# Patient Record
Sex: Female | Born: 1950 | Race: White | Hispanic: No | Marital: Married | Smoking: Never smoker
Health system: Southern US, Community
[De-identification: ages and names within clinical notes are randomized; demographics above are authoritative.]

## PROBLEM LIST (undated history)

## (undated) DIAGNOSIS — R112 Nausea with vomiting, unspecified: Secondary | ICD-10-CM

## (undated) DIAGNOSIS — Z9889 Other specified postprocedural states: Secondary | ICD-10-CM

## (undated) DIAGNOSIS — M1380 Other specified arthritis, unspecified site: Secondary | ICD-10-CM

## (undated) DIAGNOSIS — J449 Chronic obstructive pulmonary disease, unspecified: Secondary | ICD-10-CM

## (undated) DIAGNOSIS — N393 Stress incontinence (female) (male): Secondary | ICD-10-CM

## (undated) DIAGNOSIS — E890 Postprocedural hypothyroidism: Secondary | ICD-10-CM

## (undated) DIAGNOSIS — Z85850 Personal history of malignant neoplasm of thyroid: Secondary | ICD-10-CM

## (undated) HISTORY — PX: TONSILLECTOMY: SUR1361

---

## 1999-11-03 ENCOUNTER — Encounter: Admission: RE | Admit: 1999-11-03 | Discharge: 1999-11-03 | Payer: Self-pay | Admitting: Obstetrics and Gynecology

## 1999-11-03 ENCOUNTER — Encounter: Payer: Self-pay | Admitting: Obstetrics and Gynecology

## 2005-11-23 ENCOUNTER — Encounter: Admission: RE | Admit: 2005-11-23 | Discharge: 2005-11-23 | Payer: Self-pay | Admitting: Otolaryngology

## 2005-12-05 ENCOUNTER — Encounter: Admission: RE | Admit: 2005-12-05 | Discharge: 2005-12-05 | Payer: Self-pay | Admitting: Otolaryngology

## 2005-12-05 ENCOUNTER — Other Ambulatory Visit: Admission: RE | Admit: 2005-12-05 | Discharge: 2005-12-05 | Payer: Self-pay | Admitting: Interventional Radiology

## 2006-01-15 HISTORY — PX: THYROIDECTOMY: SHX17

## 2006-02-08 ENCOUNTER — Ambulatory Visit (HOSPITAL_COMMUNITY): Admission: RE | Admit: 2006-02-08 | Discharge: 2006-02-09 | Payer: Self-pay | Admitting: Otolaryngology

## 2015-04-05 DIAGNOSIS — N393 Stress incontinence (female) (male): Secondary | ICD-10-CM | POA: Diagnosis not present

## 2015-04-05 DIAGNOSIS — N3010 Interstitial cystitis (chronic) without hematuria: Secondary | ICD-10-CM | POA: Diagnosis not present

## 2015-05-12 DIAGNOSIS — J0100 Acute maxillary sinusitis, unspecified: Secondary | ICD-10-CM | POA: Diagnosis not present

## 2015-11-07 ENCOUNTER — Other Ambulatory Visit: Payer: Self-pay | Admitting: Family Medicine

## 2015-11-07 DIAGNOSIS — Z23 Encounter for immunization: Secondary | ICD-10-CM | POA: Diagnosis not present

## 2015-11-07 DIAGNOSIS — Z0000 Encounter for general adult medical examination without abnormal findings: Secondary | ICD-10-CM | POA: Diagnosis not present

## 2015-11-07 DIAGNOSIS — Z1231 Encounter for screening mammogram for malignant neoplasm of breast: Secondary | ICD-10-CM | POA: Diagnosis not present

## 2015-11-07 DIAGNOSIS — Z13220 Encounter for screening for lipoid disorders: Secondary | ICD-10-CM | POA: Diagnosis not present

## 2015-11-07 DIAGNOSIS — Z1211 Encounter for screening for malignant neoplasm of colon: Secondary | ICD-10-CM | POA: Diagnosis not present

## 2015-11-07 DIAGNOSIS — F329 Major depressive disorder, single episode, unspecified: Secondary | ICD-10-CM | POA: Diagnosis not present

## 2015-11-07 DIAGNOSIS — L819 Disorder of pigmentation, unspecified: Secondary | ICD-10-CM | POA: Diagnosis not present

## 2015-11-07 DIAGNOSIS — E079 Disorder of thyroid, unspecified: Secondary | ICD-10-CM | POA: Diagnosis not present

## 2015-11-16 ENCOUNTER — Ambulatory Visit: Payer: Self-pay

## 2015-12-14 DIAGNOSIS — H2513 Age-related nuclear cataract, bilateral: Secondary | ICD-10-CM | POA: Diagnosis not present

## 2016-02-13 DIAGNOSIS — Z1231 Encounter for screening mammogram for malignant neoplasm of breast: Secondary | ICD-10-CM | POA: Diagnosis not present

## 2016-02-13 DIAGNOSIS — J019 Acute sinusitis, unspecified: Secondary | ICD-10-CM | POA: Diagnosis not present

## 2016-02-13 DIAGNOSIS — L819 Disorder of pigmentation, unspecified: Secondary | ICD-10-CM | POA: Diagnosis not present

## 2016-02-15 DIAGNOSIS — N3010 Interstitial cystitis (chronic) without hematuria: Secondary | ICD-10-CM | POA: Diagnosis not present

## 2016-02-15 DIAGNOSIS — N3946 Mixed incontinence: Secondary | ICD-10-CM | POA: Diagnosis not present

## 2016-02-22 DIAGNOSIS — Z1231 Encounter for screening mammogram for malignant neoplasm of breast: Secondary | ICD-10-CM | POA: Diagnosis not present

## 2016-02-22 DIAGNOSIS — M810 Age-related osteoporosis without current pathological fracture: Secondary | ICD-10-CM | POA: Diagnosis not present

## 2016-03-29 DIAGNOSIS — L821 Other seborrheic keratosis: Secondary | ICD-10-CM | POA: Diagnosis not present

## 2016-05-15 DIAGNOSIS — L820 Inflamed seborrheic keratosis: Secondary | ICD-10-CM | POA: Diagnosis not present

## 2016-05-15 DIAGNOSIS — L821 Other seborrheic keratosis: Secondary | ICD-10-CM | POA: Diagnosis not present

## 2016-05-15 DIAGNOSIS — D485 Neoplasm of uncertain behavior of skin: Secondary | ICD-10-CM | POA: Diagnosis not present

## 2016-05-28 ENCOUNTER — Other Ambulatory Visit: Payer: Self-pay | Admitting: Urology

## 2016-06-07 ENCOUNTER — Encounter (HOSPITAL_BASED_OUTPATIENT_CLINIC_OR_DEPARTMENT_OTHER): Payer: Self-pay | Admitting: *Deleted

## 2016-06-08 ENCOUNTER — Encounter (HOSPITAL_BASED_OUTPATIENT_CLINIC_OR_DEPARTMENT_OTHER): Payer: Self-pay | Admitting: *Deleted

## 2016-06-08 NOTE — Progress Notes (Signed)
NPO AFTER MN.  ARRIVE AT 0700.  NEEDS HG.

## 2016-06-18 ENCOUNTER — Ambulatory Visit (HOSPITAL_BASED_OUTPATIENT_CLINIC_OR_DEPARTMENT_OTHER): Admit: 2016-06-18 | Payer: Self-pay | Admitting: Urology

## 2016-06-18 HISTORY — DX: Stress incontinence (female) (male): N39.3

## 2016-06-18 HISTORY — DX: Personal history of malignant neoplasm of thyroid: Z85.850

## 2016-06-18 HISTORY — DX: Other specified postprocedural states: Z98.890

## 2016-06-18 HISTORY — DX: Chronic obstructive pulmonary disease, unspecified: J44.9

## 2016-06-18 HISTORY — DX: Other specified arthritis, unspecified site: M13.80

## 2016-06-18 HISTORY — DX: Other specified postprocedural states: R11.2

## 2016-06-18 HISTORY — DX: Postprocedural hypothyroidism: E89.0

## 2016-06-18 SURGERY — CREATION, PUBOVAGINAL SLING
Anesthesia: General

## 2016-07-19 DIAGNOSIS — M25532 Pain in left wrist: Secondary | ICD-10-CM | POA: Diagnosis not present

## 2016-07-19 DIAGNOSIS — M79641 Pain in right hand: Secondary | ICD-10-CM | POA: Diagnosis not present

## 2016-07-19 DIAGNOSIS — M79642 Pain in left hand: Secondary | ICD-10-CM | POA: Diagnosis not present

## 2016-07-20 ENCOUNTER — Other Ambulatory Visit: Payer: Self-pay | Admitting: Orthopedic Surgery

## 2016-07-20 DIAGNOSIS — M25532 Pain in left wrist: Secondary | ICD-10-CM

## 2016-07-30 ENCOUNTER — Ambulatory Visit
Admission: RE | Admit: 2016-07-30 | Discharge: 2016-07-30 | Disposition: A | Discharge status: Home / Self Care | Payer: PPO | Source: Ambulatory Visit | Attending: Orthopedic Surgery | Admitting: Orthopedic Surgery

## 2016-07-30 DIAGNOSIS — M25532 Pain in left wrist: Secondary | ICD-10-CM

## 2016-10-16 DIAGNOSIS — M25571 Pain in right ankle and joints of right foot: Secondary | ICD-10-CM | POA: Diagnosis not present

## 2016-10-19 ENCOUNTER — Encounter: Payer: PPO | Admitting: Family Medicine

## 2016-12-17 DIAGNOSIS — H2513 Age-related nuclear cataract, bilateral: Secondary | ICD-10-CM | POA: Diagnosis not present

## 2016-12-28 DIAGNOSIS — Z23 Encounter for immunization: Secondary | ICD-10-CM | POA: Diagnosis not present

## 2017-01-30 DIAGNOSIS — Z9109 Other allergy status, other than to drugs and biological substances: Secondary | ICD-10-CM | POA: Diagnosis not present

## 2017-01-30 DIAGNOSIS — F329 Major depressive disorder, single episode, unspecified: Secondary | ICD-10-CM | POA: Diagnosis not present

## 2017-01-30 DIAGNOSIS — E079 Disorder of thyroid, unspecified: Secondary | ICD-10-CM | POA: Diagnosis not present

## 2017-01-30 DIAGNOSIS — E7800 Pure hypercholesterolemia, unspecified: Secondary | ICD-10-CM | POA: Diagnosis not present

## 2017-02-25 DIAGNOSIS — J22 Unspecified acute lower respiratory infection: Secondary | ICD-10-CM | POA: Diagnosis not present

## 2017-10-24 IMAGING — MR MR WRIST*L* W/O CM
6 series · 40 of 40 positions shown · non-contrast
Comparison: None.

CLINICAL DATA: Left medial wrist pain and weakness. Status post
fall July 19, 2016.

EXAM:
MR OF THE LEFT WRIST WITHOUT CONTRAST
TECHNIQUE: Multiplanar, multisequence MR imaging of the left wrist was
performed. No intravenous contrast was administered.

[Series 4: T2 fat-sat · oblique · left · 3.0mm · 0.28mm/px · 11 of 25 slices shown (1 of 2)]
[im 1/25]
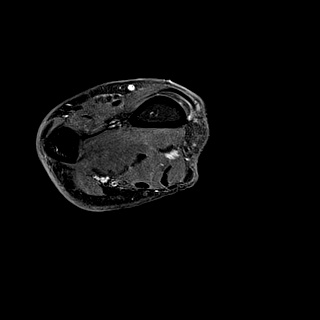
[im 3/25]
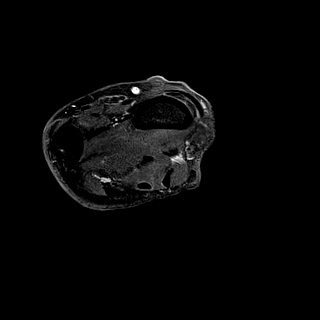
[im 5/25]
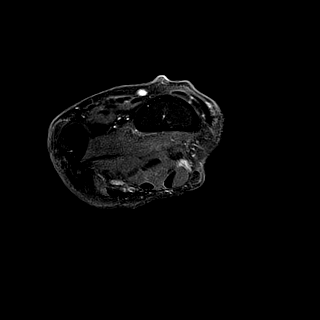
[im 8/25]
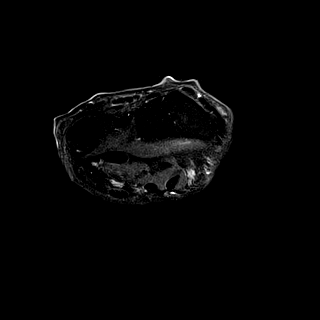
[im 10/25]
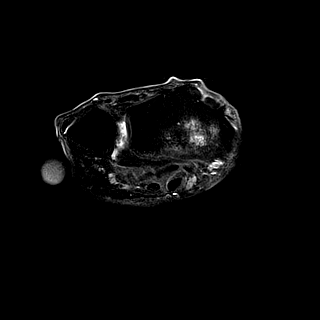
[im 13/25]
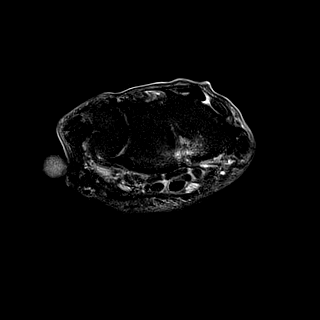
[im 15/25]
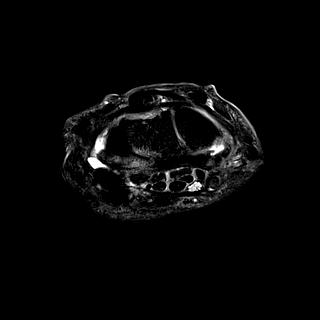
[im 17/25]
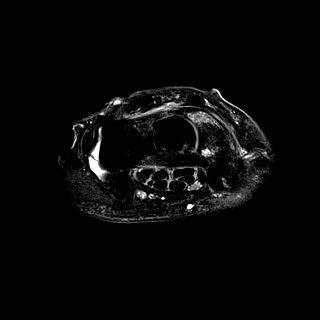
[im 20/25]
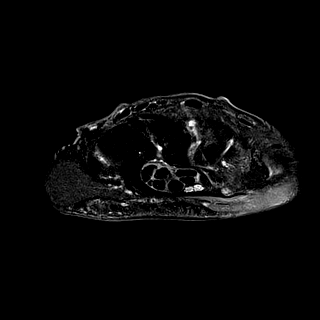
[im 22/25]
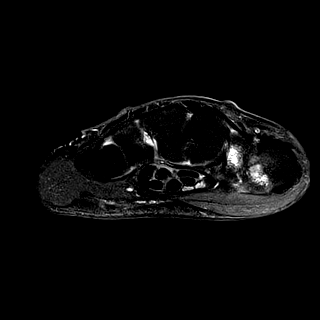
[im 25/25]
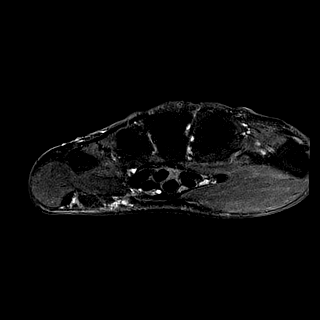

[Series 5: T1 · coronal · left · 3.0mm · 0.23mm/px · 5 of 11 slices shown]
[im 1/11]
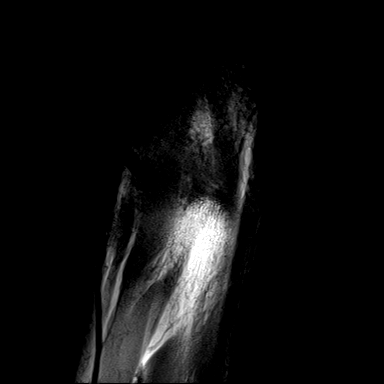
[im 3/11]
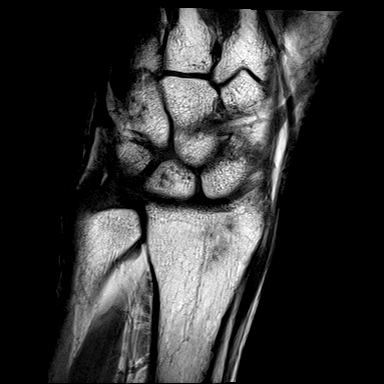
[im 6/11]
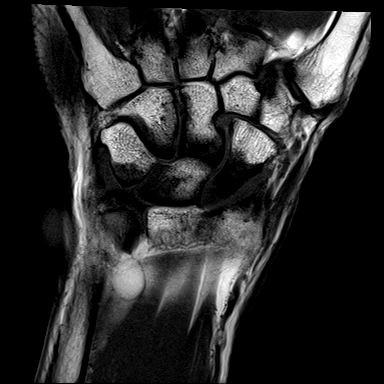
[im 8/11]
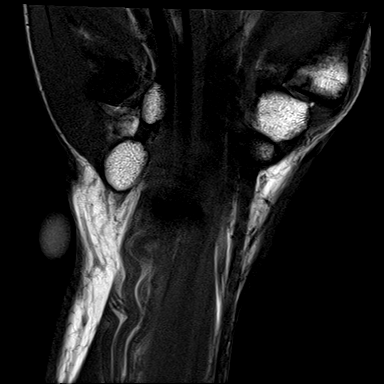
[im 11/11]
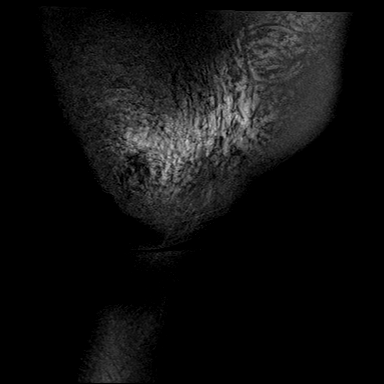

[Series 6: T2 fat-sat · coronal · left · 3.0mm · 0.28mm/px · 5 of 11 slices shown (2 of 2)]
[im 1/11]
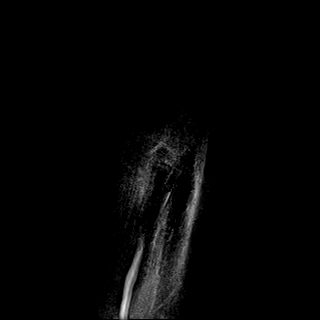
[im 3/11]
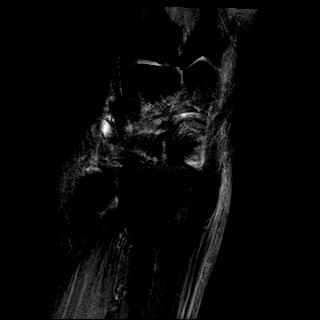
[im 6/11]
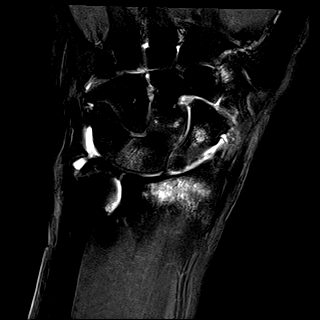
[im 8/11]
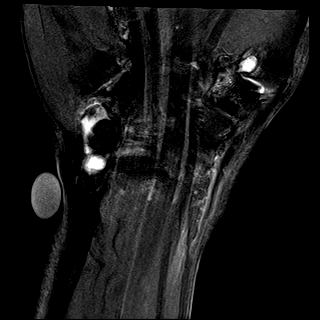
[im 11/11]
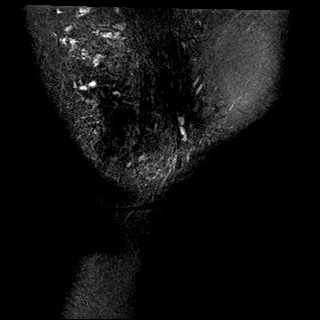

[Series 7: PD fat-sat · coronal · left · 3.0mm · 0.28mm/px · 5 of 11 slices shown]
[im 1/11]
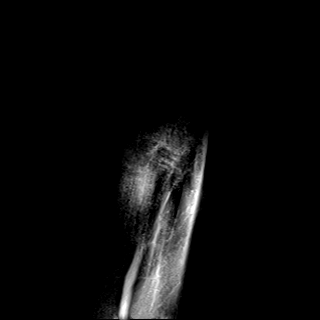
[im 3/11]
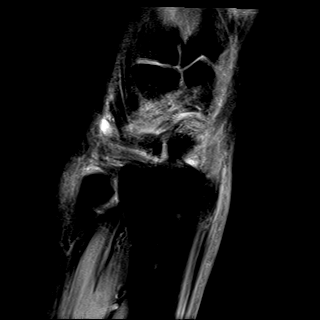
[im 6/11]
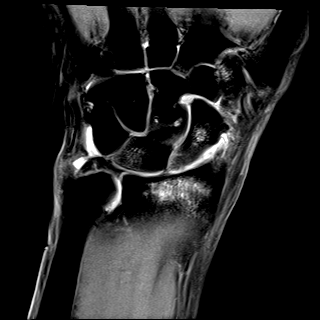
[im 8/11]
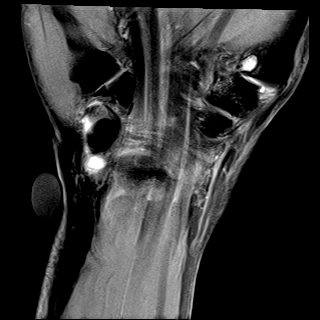
[im 11/11]
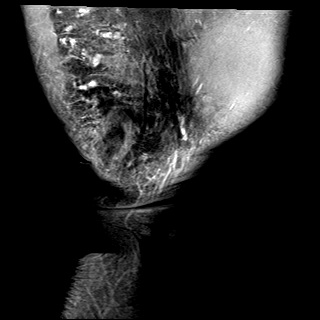

[Series 9: STIR · coronal · left · 3.0mm · 0.31mm/px · 5 of 11 slices shown]
[im 1/11]
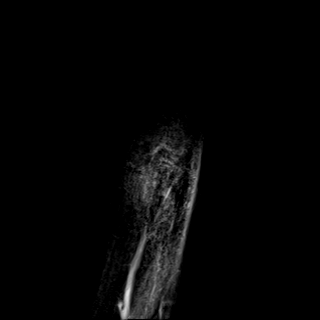
[im 3/11]
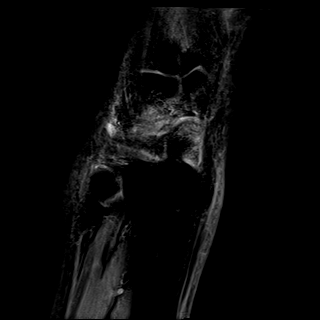
[im 6/11]
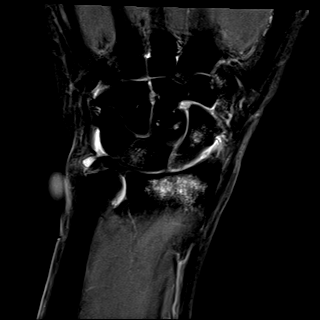
[im 8/11]
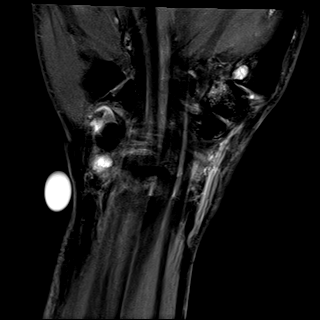
[im 11/11]
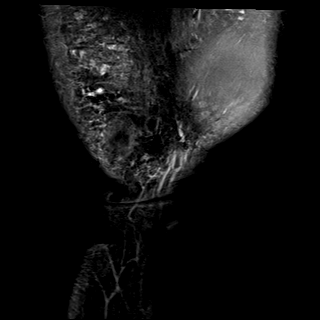

[Series 102: PD · oblique · left · 3.0mm · 0.28mm/px · 9 of 19 slices shown]
[im 1/19]
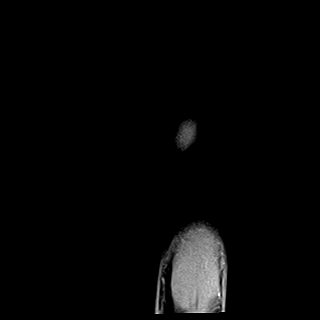
[im 3/19]
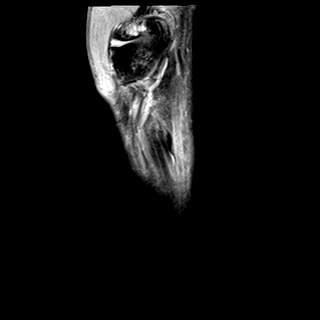
[im 5/19]
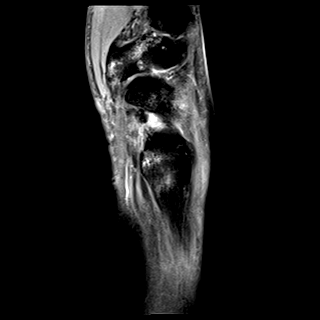
[im 7/19]
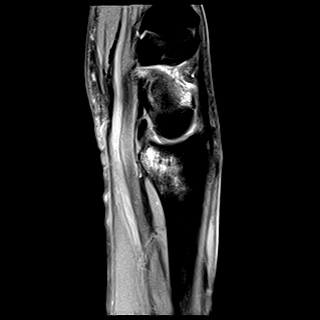
[im 10/19]
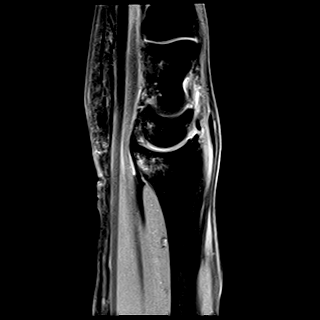
[im 12/19]
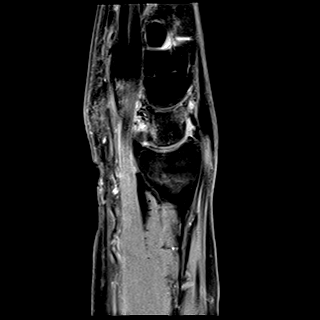
[im 14/19]
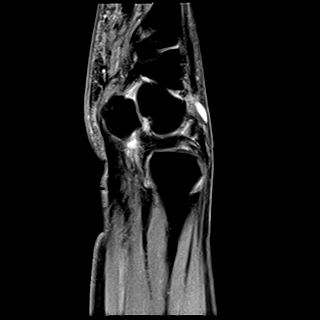
[im 16/19]
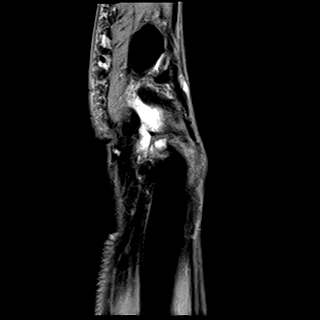
[im 19/19]
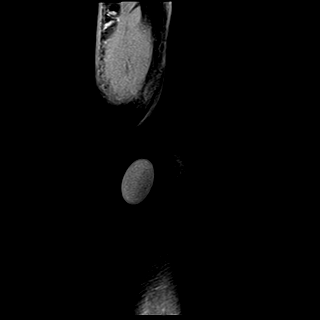

[40 of 40 positions shown; findings below may reference images not displayed]

FINDINGS: Ligaments: Intact lunotriquetral ligament. Mild increased signal in
the scapholunate ligament without disruption most concerning for
ligament strain.

Triangular fibrocartilage: Intact.

Tendons: Intact flexor compartment tendons. Mild tendinosis of the
extensor digitorum tendons without a tear. Mild tendinosis of the
extensor carpi the ulnaris tendon.

Carpal tunnel/median nerve: Normal carpal tunnel. Normal median
nerve.

Guyon's canal: Normal.

Joint/cartilage: Partial-thickness cartilage loss of the radiocarpal
joint. High-grade partial-thickness cartilage loss of the first
carpometacarpal joint with subchondral reactive marrow edema and
cystic changes. Small distal radioulnar, radiocarpal and mid carpal
joint effusion. No synovitis.

Bones/carpal alignment: Bone marrow edema in the distal volar aspect
the radius, scaphoid, and lunate without a linear component to
suggest a fracture.

Other: No fluid collection or hematoma.
IMPRESSION: 1. Mild tendinosis of the extensor digitorum tendons without a tear.
2. Mild tendinosis of the extensor carpi the ulnaris tendon.
3. Osseous contusions of the distal volar aspect the radius,
scaphoid and lunate without a fracture.
4. Mild increased signal in the scapholunate ligament without
disruption most concerning for ligament strain.

## 2017-11-22 DIAGNOSIS — D2262 Melanocytic nevi of left upper limb, including shoulder: Secondary | ICD-10-CM | POA: Diagnosis not present

## 2017-11-22 DIAGNOSIS — D225 Melanocytic nevi of trunk: Secondary | ICD-10-CM | POA: Diagnosis not present

## 2017-11-22 DIAGNOSIS — D1801 Hemangioma of skin and subcutaneous tissue: Secondary | ICD-10-CM | POA: Diagnosis not present

## 2017-11-22 DIAGNOSIS — L821 Other seborrheic keratosis: Secondary | ICD-10-CM | POA: Diagnosis not present

## 2017-11-27 DIAGNOSIS — Z9109 Other allergy status, other than to drugs and biological substances: Secondary | ICD-10-CM | POA: Diagnosis not present

## 2017-11-27 DIAGNOSIS — E039 Hypothyroidism, unspecified: Secondary | ICD-10-CM | POA: Diagnosis not present

## 2017-11-27 DIAGNOSIS — Z0000 Encounter for general adult medical examination without abnormal findings: Secondary | ICD-10-CM | POA: Diagnosis not present

## 2017-11-27 DIAGNOSIS — Z1239 Encounter for other screening for malignant neoplasm of breast: Secondary | ICD-10-CM | POA: Diagnosis not present

## 2017-11-27 DIAGNOSIS — E7800 Pure hypercholesterolemia, unspecified: Secondary | ICD-10-CM | POA: Diagnosis not present

## 2017-11-27 DIAGNOSIS — F329 Major depressive disorder, single episode, unspecified: Secondary | ICD-10-CM | POA: Diagnosis not present

## 2017-12-02 DIAGNOSIS — Z803 Family history of malignant neoplasm of breast: Secondary | ICD-10-CM | POA: Diagnosis not present

## 2017-12-02 DIAGNOSIS — Z1231 Encounter for screening mammogram for malignant neoplasm of breast: Secondary | ICD-10-CM | POA: Diagnosis not present

## 2018-01-20 DIAGNOSIS — J019 Acute sinusitis, unspecified: Secondary | ICD-10-CM | POA: Diagnosis not present

## 2018-01-20 DIAGNOSIS — E7800 Pure hypercholesterolemia, unspecified: Secondary | ICD-10-CM | POA: Diagnosis not present

## 2018-10-21 DIAGNOSIS — Z9109 Other allergy status, other than to drugs and biological substances: Secondary | ICD-10-CM | POA: Diagnosis not present

## 2018-10-21 DIAGNOSIS — E7800 Pure hypercholesterolemia, unspecified: Secondary | ICD-10-CM | POA: Diagnosis not present

## 2018-10-21 DIAGNOSIS — E2839 Other primary ovarian failure: Secondary | ICD-10-CM | POA: Diagnosis not present

## 2018-10-21 DIAGNOSIS — R42 Dizziness and giddiness: Secondary | ICD-10-CM | POA: Diagnosis not present

## 2018-10-21 DIAGNOSIS — Z23 Encounter for immunization: Secondary | ICD-10-CM | POA: Diagnosis not present

## 2018-10-21 DIAGNOSIS — E079 Disorder of thyroid, unspecified: Secondary | ICD-10-CM | POA: Diagnosis not present

## 2018-10-21 DIAGNOSIS — F329 Major depressive disorder, single episode, unspecified: Secondary | ICD-10-CM | POA: Diagnosis not present

## 2018-11-24 DIAGNOSIS — L820 Inflamed seborrheic keratosis: Secondary | ICD-10-CM | POA: Diagnosis not present

## 2018-11-24 DIAGNOSIS — D2262 Melanocytic nevi of left upper limb, including shoulder: Secondary | ICD-10-CM | POA: Diagnosis not present

## 2018-11-24 DIAGNOSIS — L821 Other seborrheic keratosis: Secondary | ICD-10-CM | POA: Diagnosis not present

## 2018-11-24 DIAGNOSIS — D225 Melanocytic nevi of trunk: Secondary | ICD-10-CM | POA: Diagnosis not present

## 2018-11-24 DIAGNOSIS — D1801 Hemangioma of skin and subcutaneous tissue: Secondary | ICD-10-CM | POA: Diagnosis not present

## 2019-06-22 DIAGNOSIS — G43119 Migraine with aura, intractable, without status migrainosus: Secondary | ICD-10-CM | POA: Diagnosis not present

## 2019-06-22 DIAGNOSIS — H5319 Other subjective visual disturbances: Secondary | ICD-10-CM | POA: Diagnosis not present

## 2019-06-22 DIAGNOSIS — H2513 Age-related nuclear cataract, bilateral: Secondary | ICD-10-CM | POA: Diagnosis not present

## 2019-10-17 DIAGNOSIS — Z1231 Encounter for screening mammogram for malignant neoplasm of breast: Secondary | ICD-10-CM | POA: Diagnosis not present

## 2019-10-26 DIAGNOSIS — E7800 Pure hypercholesterolemia, unspecified: Secondary | ICD-10-CM | POA: Diagnosis not present

## 2019-10-26 DIAGNOSIS — E079 Disorder of thyroid, unspecified: Secondary | ICD-10-CM | POA: Diagnosis not present

## 2019-11-12 DIAGNOSIS — Z0000 Encounter for general adult medical examination without abnormal findings: Secondary | ICD-10-CM | POA: Diagnosis not present

## 2020-05-31 DIAGNOSIS — U071 COVID-19: Secondary | ICD-10-CM | POA: Diagnosis not present

## 2020-05-31 DIAGNOSIS — F439 Reaction to severe stress, unspecified: Secondary | ICD-10-CM | POA: Diagnosis not present

## 2020-05-31 DIAGNOSIS — F32A Depression, unspecified: Secondary | ICD-10-CM | POA: Diagnosis not present

## 2020-05-31 DIAGNOSIS — R110 Nausea: Secondary | ICD-10-CM | POA: Diagnosis not present

## 2020-05-31 DIAGNOSIS — E079 Disorder of thyroid, unspecified: Secondary | ICD-10-CM | POA: Diagnosis not present

## 2020-09-22 DIAGNOSIS — Z9109 Other allergy status, other than to drugs and biological substances: Secondary | ICD-10-CM | POA: Diagnosis not present

## 2020-09-22 DIAGNOSIS — L209 Atopic dermatitis, unspecified: Secondary | ICD-10-CM | POA: Diagnosis not present

## 2020-09-22 DIAGNOSIS — E079 Disorder of thyroid, unspecified: Secondary | ICD-10-CM | POA: Diagnosis not present

## 2020-09-22 DIAGNOSIS — Z0000 Encounter for general adult medical examination without abnormal findings: Secondary | ICD-10-CM | POA: Diagnosis not present

## 2020-09-22 DIAGNOSIS — E2839 Other primary ovarian failure: Secondary | ICD-10-CM | POA: Diagnosis not present

## 2020-09-22 DIAGNOSIS — F439 Reaction to severe stress, unspecified: Secondary | ICD-10-CM | POA: Diagnosis not present

## 2020-09-22 DIAGNOSIS — E7800 Pure hypercholesterolemia, unspecified: Secondary | ICD-10-CM | POA: Diagnosis not present

## 2020-10-04 DIAGNOSIS — E7800 Pure hypercholesterolemia, unspecified: Secondary | ICD-10-CM | POA: Diagnosis not present

## 2020-10-04 DIAGNOSIS — E079 Disorder of thyroid, unspecified: Secondary | ICD-10-CM | POA: Diagnosis not present

## 2020-10-18 DIAGNOSIS — Z780 Asymptomatic menopausal state: Secondary | ICD-10-CM | POA: Diagnosis not present

## 2020-10-18 DIAGNOSIS — Z1231 Encounter for screening mammogram for malignant neoplasm of breast: Secondary | ICD-10-CM | POA: Diagnosis not present

## 2020-10-18 DIAGNOSIS — M8588 Other specified disorders of bone density and structure, other site: Secondary | ICD-10-CM | POA: Diagnosis not present

## 2020-10-18 DIAGNOSIS — M810 Age-related osteoporosis without current pathological fracture: Secondary | ICD-10-CM | POA: Diagnosis not present

## 2022-04-02 ENCOUNTER — Encounter: Payer: Self-pay | Admitting: Podiatry

## 2022-04-02 ENCOUNTER — Ambulatory Visit: Payer: PPO | Admitting: Podiatry

## 2022-04-02 DIAGNOSIS — L600 Ingrowing nail: Secondary | ICD-10-CM

## 2022-04-02 DIAGNOSIS — L603 Nail dystrophy: Secondary | ICD-10-CM

## 2022-04-02 NOTE — Progress Notes (Signed)
  Subjective:  Patient ID: Marcia Lyons, female    DOB: 1950/09/01,   MRN: [id number]  Chief Complaint  Patient presents with   Nail Problem    Left hallux nail thick and discolored     72 y.o. female presents for concern of left hallux nail thickened and dystrophic. Has been this way for years. Relates has been painful at times.  . Denies any other pedal complaints. Denies n/v/f/c.   Past Medical History:  Diagnosis Date   Allergic arthritis    COPD, mild ([place])    NON-SMOKER   History of thyroid cancer    2008  S/P  RIGHT THRYOIDECTOMY--  PER PT TOLD NO OTHER INTERVENTION AND HAS NOT RECURRENCE   Hypothyroidism, postsurgical    PONV (postoperative nausea and vomiting)    SUI (stress urinary incontinence, female)     Objective:  Physical Exam: Vascular: DP/PT pulses 2/4 bilateral. CFT <3 seconds. Normal hair growth on digits. No edema.  Skin. No lacerations or abrasions bilateral feet. Left hallux nail thickened and dystrophic Musculoskeletal: MMT 5/5 bilateral lower extremities in DF, PF, Inversion and Eversion. Deceased ROM in DF of ankle joint.  Neurological: Sensation intact to light touch.   Assessment:   1. Dystrophic nail   2. Ingrown left greater toenail      Plan:  Patient was evaluated and treated and all questions answered. Discussed ingrown toenails etiology and treatment options including procedure for removal vs conservative care.  Patient requesting removal of ingrown nail today. Procedure below.  Discussed procedure and post procedure care and patient expressed understanding.  Will follow-up in 2 weeks for nail check or sooner if any problems arise.    Procedure:  Procedure: total Nail Avulsion of left hallux nail  Surgeon: Lorenda Peck, DPM  Pre-op Dx: Ingrown toenail without infection Post-op: Same  Place of Surgery: Office exam room.  Indications for surgery: Painful and ingrown toenail.    The patient is requesting removal of nail with  chemical matrixectomy. Risks and complications were discussed with the patient for which they understand and written consent was obtained. Under sterile conditions a total of 3 mL of  1% lidocaine plain was infiltrated in a hallux block fashion. Once anesthetized, the skin was prepped in sterile fashion. A tourniquet was then applied. Next the entire left hallux nail was removed.  Next phenol was then applied under standard conditions and copiously irrigated. Silvadene was applied. A dry sterile dressing was applied. After application of the dressing the tourniquet was removed and there is found to be an immediate capillary refill time to the digit. The patient tolerated the procedure well without any complications. Post procedure instructions were discussed the patient for which he verbally understood. Follow-up in two weeks for nail check or sooner if any problems are to arise. Discussed signs/symptoms of infection and directed to call the office immediately should any occur or go directly to the emergency room. In the meantime, encouraged to call the office with any questions, concerns, changes symptoms.   Lorenda Peck, DPM

## 2022-04-02 NOTE — Patient Instructions (Signed)

## 2022-04-16 ENCOUNTER — Ambulatory Visit (INDEPENDENT_AMBULATORY_CARE_PROVIDER_SITE_OTHER): Payer: PPO | Admitting: Podiatry

## 2022-04-16 ENCOUNTER — Encounter: Payer: Self-pay | Admitting: Podiatry

## 2022-04-16 DIAGNOSIS — L600 Ingrowing nail: Secondary | ICD-10-CM

## 2022-04-16 DIAGNOSIS — L603 Nail dystrophy: Secondary | ICD-10-CM

## 2022-04-16 NOTE — Progress Notes (Signed)
  Subjective:  Patient ID: Marcia Lyons, female    DOB: 1951-01-04,   MRN: [id number]  No chief complaint on file.   73 y.o. female presents for concern of left hallux total nail avulsion. Relates doing well with minimal pain and has been soaking as instructed . Denies any other pedal complaints. Denies n/v/f/c.   Past Medical History:  Diagnosis Date   Allergic arthritis    COPD, mild ([place])    NON-SMOKER   History of thyroid cancer    2008  S/P  RIGHT THRYOIDECTOMY--  PER PT TOLD NO OTHER INTERVENTION AND HAS NOT RECURRENCE   Hypothyroidism, postsurgical    PONV (postoperative nausea and vomiting)    SUI (stress urinary incontinence, female)     Objective:  Physical Exam: Vascular: DP/PT pulses 2/4 bilateral. CFT <3 seconds. Normal hair growth on digits. No edema.  Skin. No lacerations or abrasions bilateral feet. Left hallux nail bed healing well. No erythema edema or purulence noted.  Musculoskeletal: MMT 5/5 bilateral lower extremities in DF, PF, Inversion and Eversion. Deceased ROM in DF of ankle joint.  Neurological: Sensation intact to light touch.   Assessment:  No diagnosis found.   Plan:  Patient was evaluated and treated and all questions answered. Toe was evaluated and appears to be healing well.  May discontinue soaks and neosporin.  Patient to follow-up as needed.    Lorenda Peck, DPM
